# Patient Record
Sex: Female | Born: 1935 | Race: White | Hispanic: No | Marital: Single
Health system: Southern US, Community
[De-identification: ages and names within clinical notes are randomized; demographics above are authoritative.]

---

## 2013-07-12 ENCOUNTER — Inpatient Hospital Stay: Payer: Self-pay | Admitting: Specialist

## 2013-07-12 LAB — COMPREHENSIVE METABOLIC PANEL
ALK PHOS: 83 U/L
AST: 40 U/L — AB (ref 15–37)
Albumin: 3 g/dL — ABNORMAL LOW (ref 3.4–5.0)
Anion Gap: 2 — ABNORMAL LOW (ref 7–16)
BILIRUBIN TOTAL: 0.5 mg/dL (ref 0.2–1.0)
BUN: 23 mg/dL — AB (ref 7–18)
CALCIUM: 8.9 mg/dL (ref 8.5–10.1)
CREATININE: 0.98 mg/dL (ref 0.60–1.30)
Chloride: 106 mmol/L (ref 98–107)
Co2: 33 mmol/L — ABNORMAL HIGH (ref 21–32)
EGFR (Non-African Amer.): 52 — ABNORMAL LOW
Glucose: 80 mg/dL (ref 65–99)
Osmolality: 284 (ref 275–301)
Potassium: 4 mmol/L (ref 3.5–5.1)
SGPT (ALT): 26 U/L (ref 12–78)
Sodium: 141 mmol/L (ref 136–145)
Total Protein: 7 g/dL (ref 6.4–8.2)

## 2013-07-12 LAB — PROTIME-INR
INR: 1
Prothrombin Time: 13.3 secs (ref 11.5–14.7)

## 2013-07-12 LAB — VALPROIC ACID LEVEL: Valproic Acid: 52 ug/mL

## 2013-07-12 LAB — CBC
HCT: 41.5 % (ref 35.0–47.0)
HGB: 13.9 g/dL (ref 12.0–16.0)
MCH: 34.2 pg — ABNORMAL HIGH (ref 26.0–34.0)
MCHC: 33.4 g/dL (ref 32.0–36.0)
MCV: 102 fL — ABNORMAL HIGH (ref 80–100)
PLATELETS: 184 10*3/uL (ref 150–440)
RBC: 4.06 10*6/uL (ref 3.80–5.20)
RDW: 13.7 % (ref 11.5–14.5)
WBC: 9.2 10*3/uL (ref 3.6–11.0)

## 2013-07-12 LAB — TROPONIN I

## 2013-07-13 LAB — CBC WITH DIFFERENTIAL/PLATELET
BASOS ABS: 0 10*3/uL (ref 0.0–0.1)
Basophil %: 0.2 %
Eosinophil #: 0 10*3/uL (ref 0.0–0.7)
Eosinophil %: 0.1 %
HCT: 37.2 % (ref 35.0–47.0)
HGB: 12.3 g/dL (ref 12.0–16.0)
LYMPHS PCT: 3.8 %
Lymphocyte #: 0.4 10*3/uL — ABNORMAL LOW (ref 1.0–3.6)
MCH: 33.8 pg (ref 26.0–34.0)
MCHC: 33.1 g/dL (ref 32.0–36.0)
MCV: 102 fL — ABNORMAL HIGH (ref 80–100)
Monocyte #: 1.2 x10 3/mm — ABNORMAL HIGH (ref 0.2–0.9)
Monocyte %: 11.3 %
NEUTROS PCT: 84.6 %
Neutrophil #: 9.2 10*3/uL — ABNORMAL HIGH (ref 1.4–6.5)
PLATELETS: 179 10*3/uL (ref 150–440)
RBC: 3.64 10*6/uL — ABNORMAL LOW (ref 3.80–5.20)
RDW: 13.7 % (ref 11.5–14.5)
WBC: 10.9 10*3/uL (ref 3.6–11.0)

## 2013-07-13 LAB — BASIC METABOLIC PANEL
ANION GAP: 9 (ref 7–16)
BUN: 15 mg/dL (ref 7–18)
CHLORIDE: 101 mmol/L (ref 98–107)
CREATININE: 0.77 mg/dL (ref 0.60–1.30)
Calcium, Total: 8.4 mg/dL — ABNORMAL LOW (ref 8.5–10.1)
Co2: 29 mmol/L (ref 21–32)
EGFR (Non-African Amer.): >60
Glucose: 120 mg/dL — ABNORMAL HIGH (ref 65–99)
Osmolality: 280 (ref 275–301)
POTASSIUM: 4 mmol/L (ref 3.5–5.1)
SODIUM: 139 mmol/L (ref 136–145)

## 2013-07-14 LAB — CBC WITH DIFFERENTIAL/PLATELET
BASOS PCT: 0.5 %
Basophil #: 0 10*3/uL (ref 0.0–0.1)
Eosinophil #: 0.2 10*3/uL (ref 0.0–0.7)
Eosinophil %: 1.7 %
HCT: 39.3 % (ref 35.0–47.0)
HGB: 13.1 g/dL (ref 12.0–16.0)
Lymphocyte #: 1.1 10*3/uL (ref 1.0–3.6)
Lymphocyte %: 11.3 %
MCH: 34.4 pg — ABNORMAL HIGH (ref 26.0–34.0)
MCHC: 33.2 g/dL (ref 32.0–36.0)
MCV: 104 fL — ABNORMAL HIGH (ref 80–100)
Monocyte #: 1.8 x10 3/mm — ABNORMAL HIGH (ref 0.2–0.9)
Monocyte %: 18.6 %
NEUTROS ABS: 6.7 10*3/uL — AB (ref 1.4–6.5)
NEUTROS PCT: 67.9 %
PLATELETS: 165 10*3/uL (ref 150–440)
RBC: 3.8 10*6/uL (ref 3.80–5.20)
RDW: 13.9 % (ref 11.5–14.5)
WBC: 9.8 10*3/uL (ref 3.6–11.0)

## 2014-02-08 ENCOUNTER — Inpatient Hospital Stay: Payer: Self-pay | Admitting: Internal Medicine

## 2014-02-08 LAB — BASIC METABOLIC PANEL
Anion Gap: 7 (ref 7–16)
BUN: 36 mg/dL — AB (ref 7–18)
CALCIUM: 9.4 mg/dL (ref 8.5–10.1)
CHLORIDE: 106 mmol/L (ref 98–107)
Co2: 32 mmol/L (ref 21–32)
Creatinine: 1.1 mg/dL (ref 0.60–1.30)
GFR CALC NON AF AMER: 50 — AB
GLUCOSE: 139 mg/dL — AB (ref 65–99)
Osmolality: 299 (ref 275–301)
Potassium: 4.4 mmol/L (ref 3.5–5.1)
Sodium: 145 mmol/L (ref 136–145)

## 2014-02-08 LAB — CBC
HCT: 47.1 % — AB (ref 35.0–47.0)
HGB: 15.1 g/dL (ref 12.0–16.0)
MCH: 33.6 pg (ref 26.0–34.0)
MCHC: 32 g/dL (ref 32.0–36.0)
MCV: 105 fL — ABNORMAL HIGH (ref 80–100)
PLATELETS: 177 10*3/uL (ref 150–440)
RBC: 4.49 10*6/uL (ref 3.80–5.20)
RDW: 13.7 % (ref 11.5–14.5)
WBC: 10 10*3/uL (ref 3.6–11.0)

## 2014-02-08 LAB — PRO B NATRIURETIC PEPTIDE: B-Type Natriuretic Peptide: 8276 pg/mL — ABNORMAL HIGH (ref 0–450)

## 2014-02-08 LAB — TROPONIN I: Troponin-I: 0.04 ng/mL

## 2014-02-09 LAB — BASIC METABOLIC PANEL
Anion Gap: 15 (ref 7–16)
BUN: 32 mg/dL — AB (ref 7–18)
CHLORIDE: 106 mmol/L (ref 98–107)
Calcium, Total: 8.6 mg/dL (ref 8.5–10.1)
Co2: 26 mmol/L (ref 21–32)
Creatinine: 0.99 mg/dL (ref 0.60–1.30)
GFR CALC NON AF AMER: 57 — AB
GLUCOSE: 123 mg/dL — AB (ref 65–99)
Osmolality: 301 (ref 275–301)
Potassium: 3.9 mmol/L (ref 3.5–5.1)
SODIUM: 147 mmol/L — AB (ref 136–145)

## 2014-02-09 LAB — CBC WITH DIFFERENTIAL/PLATELET
BANDS NEUTROPHIL: 2 %
HCT: 44.4 % (ref 35.0–47.0)
HGB: 14.5 g/dL (ref 12.0–16.0)
Lymphocytes: 10 %
MCH: 34.2 pg — ABNORMAL HIGH (ref 26.0–34.0)
MCHC: 32.7 g/dL (ref 32.0–36.0)
MCV: 105 fL — AB (ref 80–100)
MONOS PCT: 5 %
Metamyelocyte: 1 %
NRBC/100 WBC: 7 /
PLATELETS: 150 10*3/uL (ref 150–440)
RBC: 4.25 10*6/uL (ref 3.80–5.20)
RDW: 13.8 % (ref 11.5–14.5)
SEGMENTED NEUTROPHILS: 81 %
VARIANT LYMPHOCYTE - H1-RLYMPH: 1 %
WBC: 9 10*3/uL (ref 3.6–11.0)

## 2014-02-09 LAB — TROPONIN I
Troponin-I: 0.05 ng/mL
Troponin-I: 0.05 ng/mL

## 2014-02-09 LAB — CK TOTAL AND CKMB (NOT AT ARMC)
CK, Total: 23 U/L — ABNORMAL LOW (ref 26–192)
CK, Total: 24 U/L — ABNORMAL LOW (ref 26–192)
CK-MB: 0.6 ng/mL (ref 0.5–3.6)
CK-MB: 0.7 ng/mL (ref 0.5–3.6)

## 2014-02-09 LAB — TSH: Thyroid Stimulating Horm: 1.77 u[IU]/mL

## 2014-02-09 LAB — MAGNESIUM: Magnesium: 1.9 mg/dL

## 2014-02-10 LAB — BASIC METABOLIC PANEL
ANION GAP: 8 (ref 7–16)
BUN: 24 mg/dL — AB (ref 7–18)
CO2: 34 mmol/L — AB (ref 21–32)
Calcium, Total: 8.5 mg/dL (ref 8.5–10.1)
Chloride: 100 mmol/L (ref 98–107)
Creatinine: 0.96 mg/dL (ref 0.60–1.30)
EGFR (Non-African Amer.): 59 — ABNORMAL LOW
Glucose: 94 mg/dL (ref 65–99)
OSMOLALITY: 287 (ref 275–301)
Potassium: 3.8 mmol/L (ref 3.5–5.1)
Sodium: 142 mmol/L (ref 136–145)

## 2014-02-10 LAB — URINALYSIS, COMPLETE
Bilirubin,UR: NEGATIVE
Blood: NEGATIVE
Glucose,UR: NEGATIVE mg/dL (ref 0–75)
LEUKOCYTE ESTERASE: NEGATIVE
Nitrite: NEGATIVE
Ph: 5 (ref 4.5–8.0)
Protein: NEGATIVE
RBC,UR: 1 /HPF (ref 0–5)
Specific Gravity: 1.024 (ref 1.003–1.030)
Squamous Epithelial: 4

## 2014-02-10 LAB — CBC WITH DIFFERENTIAL/PLATELET
HCT: 45.8 % (ref 35.0–47.0)
HGB: 15.2 g/dL (ref 12.0–16.0)
Lymphocytes: 15 %
MCH: 34.3 pg — ABNORMAL HIGH (ref 26.0–34.0)
MCHC: 33.2 g/dL (ref 32.0–36.0)
MCV: 103 fL — ABNORMAL HIGH (ref 80–100)
MONOS PCT: 5 %
NRBC/100 WBC: 1 /
Platelet: 156 10*3/uL (ref 150–440)
RBC: 4.44 10*6/uL (ref 3.80–5.20)
RDW: 13.4 % (ref 11.5–14.5)
SEGMENTED NEUTROPHILS: 80 %
WBC: 11.4 10*3/uL — ABNORMAL HIGH (ref 3.6–11.0)

## 2014-02-13 LAB — CULTURE, BLOOD (SINGLE)

## 2014-05-29 ENCOUNTER — Inpatient Hospital Stay
Admit: 2014-05-29 | Disposition: A | Discharge status: Skilled Nursing Facility | Payer: Self-pay | Attending: Internal Medicine | Admitting: Internal Medicine

## 2014-05-29 LAB — CBC
HCT: 44.2 % (ref 35.0–47.0)
HGB: 15.1 g/dL (ref 12.0–16.0)
MCH: 35.3 pg — ABNORMAL HIGH (ref 26.0–34.0)
MCHC: 34.2 g/dL (ref 32.0–36.0)
MCV: 103 fL — ABNORMAL HIGH (ref 80–100)
Platelet: 234 10*3/uL (ref 150–440)
RBC: 4.28 10*6/uL (ref 3.80–5.20)
RDW: 15 % — ABNORMAL HIGH (ref 11.5–14.5)
WBC: 9.6 10*3/uL (ref 3.6–11.0)

## 2014-05-29 LAB — URINALYSIS, COMPLETE
Bacteria: NONE SEEN
Bilirubin,UR: NEGATIVE
Blood: NEGATIVE
GLUCOSE, UR: NEGATIVE mg/dL (ref 0–75)
LEUKOCYTE ESTERASE: NEGATIVE
Nitrite: NEGATIVE
PH: 7 (ref 4.5–8.0)
Protein: NEGATIVE
Specific Gravity: 1.018 (ref 1.003–1.030)
WBC UR: NONE SEEN /HPF (ref 0–5)

## 2014-05-29 LAB — COMPREHENSIVE METABOLIC PANEL
ALK PHOS: 110 U/L
Albumin: 3 g/dL — ABNORMAL LOW
Anion Gap: 9 (ref 7–16)
BUN: 13 mg/dL
Bilirubin,Total: 1.1 mg/dL
CALCIUM: 8.6 mg/dL — AB
CHLORIDE: 99 mmol/L — AB
CO2: 30 mmol/L
Creatinine: 0.8 mg/dL
EGFR (African American): >60
Glucose: 99 mg/dL
POTASSIUM: 3.6 mmol/L
SGOT(AST): 29 U/L
SGPT (ALT): 18 U/L
SODIUM: 138 mmol/L
Total Protein: 6.9 g/dL

## 2014-05-29 LAB — TROPONIN I
Troponin-I: 0.05 ng/mL — ABNORMAL HIGH
Troponin-I: 0.04 ng/mL — ABNORMAL HIGH
Troponin-I: 0.04 ng/mL — ABNORMAL HIGH

## 2014-05-29 LAB — VALPROIC ACID LEVEL: Valproic Acid: <10 ug/mL — ABNORMAL LOW (ref 50–100)

## 2014-05-29 LAB — DIGOXIN LEVEL: Digoxin: 0.5 ng/mL

## 2014-05-29 LAB — LIPASE, BLOOD: LIPASE: 31 U/L

## 2014-05-30 LAB — CBC WITH DIFFERENTIAL/PLATELET
Basophil #: 0.1 10*3/uL (ref 0.0–0.1)
Basophil %: 0.8 %
EOS PCT: 0.7 %
Eosinophil #: 0.1 10*3/uL (ref 0.0–0.7)
HCT: 42.3 % (ref 35.0–47.0)
HGB: 14 g/dL (ref 12.0–16.0)
Lymphocyte #: 1.1 10*3/uL (ref 1.0–3.6)
Lymphocyte %: 13 %
MCH: 34.3 pg — AB (ref 26.0–34.0)
MCHC: 33.1 g/dL (ref 32.0–36.0)
MCV: 104 fL — ABNORMAL HIGH (ref 80–100)
MONOS PCT: 10.8 %
Monocyte #: 0.9 x10 3/mm (ref 0.2–0.9)
NEUTROS PCT: 74.7 %
Neutrophil #: 6.1 10*3/uL (ref 1.4–6.5)
PLATELETS: 238 10*3/uL (ref 150–440)
RBC: 4.08 10*6/uL (ref 3.80–5.20)
RDW: 15.1 % — AB (ref 11.5–14.5)
WBC: 8.1 10*3/uL (ref 3.6–11.0)

## 2014-05-30 LAB — BASIC METABOLIC PANEL
Anion Gap: 9 (ref 7–16)
BUN: 11 mg/dL
CHLORIDE: 98 mmol/L — AB
CO2: 31 mmol/L
CREATININE: 0.65 mg/dL
Calcium, Total: 8.2 mg/dL — ABNORMAL LOW
EGFR (Non-African Amer.): >60
Glucose: 94 mg/dL
Potassium: 3.3 mmol/L — ABNORMAL LOW
Sodium: 138 mmol/L

## 2014-05-30 LAB — LIPID PANEL
CHOLESTEROL: 188 mg/dL
HDL: 39 mg/dL — AB
Ldl Cholesterol, Calc: 119 mg/dL — ABNORMAL HIGH
Triglycerides: 152 mg/dL — ABNORMAL HIGH
VLDL CHOLESTEROL, CALC: 30 mg/dL

## 2014-06-05 NOTE — Consult Note (Signed)
PATIENT NAME:  Beverly Warren, BOATMAN MR#:  [id number] DATE OF BIRTH:  [birth year]  DATE OF CONSULTATION:  07/12/2013  REFERRING PHYSICIAN:  Valinda Hoar, MD CONSULTING PHYSICIAN:  Duane Lope. Judithann Sheen, MD  FAMILY PHYSICIAN:  [employer].   REASON FOR CONSULTATION:  Preop medical clearance prior to ankle surgery.   HISTORY OF PRESENT ILLNESS: The patient is an 81 year old female with a long-standing history of Alzheimer's dementia who resides at the memory care unit of [facility]. She has a history of aortic valve replacement complicated by transient atrial fibrillation. She has a pacemaker in place which is followed yearly at [facility]. Also has a history of stroke. Presents after on an unwitnessed fall to the Emergency Room with ankle pain. Was found to have an ankle fracture. She does not remember falling. Denies chest pain or shortness of breath. Denies dizziness or palpitations. She denied syncope. She has been admitted by orthopedics for ankle surgery and consultation was subsequently requested.   PAST MEDICAL HISTORY: 1.  Previous stroke in 2012.  2.  Alzheimer's dementia.  3.  Hyperlipidemia.  4.  Benign hypertension.  5.  Status post aortic valve replacement.  6.  Status post pacemaker implant.  7.  History of transient atrial fibrillation, postoperative valve repair which has been stable for over a year now.   MEDICATIONS ON ADMISSION:  1.  Zoloft 25 mg p.o. at bedtime.  2.  Seroquel 12.5 mg p.o. daily as needed for agitation.  3.  Pravastatin 10 mg p.o. daily.  4.  Namenda 10 mg p.o. daily.  5.  Claritin 10 mg p.o. daily.  6.  Flonase 2 puffs in each nostril daily.  7.  Aricept 10 mg p.o. at bedtime.  8.  Depakote 250 mg p.o. q.a.m. and 500 mg p.o. q.p.m.  9.  Aspirin 81 mg p.o. daily.  10.  Fosamax 70 mg p.o. q. week.   ALLERGIES: PENICILLIN, SULFA, CODEINE, ACE INHIBITORS, DILTIAZEM, MORPHINE AND TESSALON.   SOCIAL HISTORY: Negative for alcohol or tobacco abuse.    FAMILY HISTORY: Unable to obtain from the patient due to her dementia.   REVIEW OF SYSTEMS: Unable to obtain because of her confusion.   PHYSICAL EXAMINATION: GENERAL: The patient is in no acute distress.  VITAL SIGNS: Currently remarkable for a blood pressure of 190/77, heart rate 69, respiratory rate of 18, temperature of 97.5.  HEENT: Normocephalic, atraumatic. Pupils equal, round, reactive to light and accommodation. Extraocular movements are intact. Sclerae are anicteric. Conjunctivae are clear. Oropharynx is clear.  NECK: Supple without JVD. No adenopathy or thyromegaly is noted.  LUNGS: Clear to auscultation and percussion without wheezes, rales or rhonchi. No dullness. Respiratory effort is normal.  CARDIAC: Regular rate and rhythm with a normal S1, S2. There is a 2/6 systolic murmur noted. No rubs or gallops are present. PMI is nondisplaced. Chest wall is nontender.  ABDOMEN: Soft, nontender, with normoactive bowel sounds. No organomegaly or masses were appreciated. No hernias or bruits were noted.  EXTREMITIES: Revealed trace edema. Pulses were 2+ bilaterally.  SKIN: Warm and dry without rash or lesions.  NEUROLOGIC: Cranial nerves II through XII grossly intact. Deep tendon reflexes were symmetric. Motor and sensory exam is nonfocal.  PSYCHIATRIC: Revealed a patient who was alert and oriented to person only. She was pleasant.   LABORATORY, DIAGNOSTIC AND RADIOLOGICAL DATA:  EKG reveals sinus rhythm with a left bundle branch block versus paced rhythm at 65 beats per minute. Chest x-ray was unremarkable. Troponin was  less than 0.02. Glucose 80 with a BUN of 23, creatinine 0.98 and a GFR of greater than 60. White count 9.2 with a hemoglobin of 13.9.   ASSESSMENT: 1.  Right ankle fracture.  2.  History of transient atrial fibrillation, now resolved.  3.  Pacemaker status.  4.  Status post aortic valve replacement.  5.  Benign hypertension.  6.  Hyperlipidemia.  7.  Alzheimer's  dementia.   PLAN: The patient appears medically stable and is cleared for orthopedic surgery. We will continue her outpatient regimen for now. Follow up routine labs in the morning.   Thank you for the consultation. We will continue to follow this patient with you while in the hospital. Please call if questions arise.   ____________________________ Duane LopeJeffrey D. Judithann SheenSparks, MD jds:cs D: 07/12/2013 13:07:44 ET T: 07/12/2013 14:56:23 ET JOB#: [id number]  cc: Duane LopeJeffrey D. Judithann SheenSparks, MD, <Dictator> Elvera Almario Rodena Medin Avril Busser MD ELECTRONICALLY SIGNED 07/12/2013 22:38

## 2014-06-05 NOTE — Discharge Summary (Signed)
PATIENT NAME:  Beverly Warren, Beverly MR#:  [id number] DATE OF BIRTH:  [birth year]  DATE OF ADMISSION:  07/12/2013 DATE OF DISCHARGE:  07/15/2013  FINAL DIAGNOSES:  1.  Displaced bimalleolar fracture-dislocation, right ankle.  2.  Advanced dementia.  3.  History of atrial fibrillation with pacemaker. 4.  Prior stroke in 2012. 5.  Alzheimer's dementia. 6.  Hyperlipidemia. 7.  Benign hypertension, status post aortic valve replacement and status post pacemaker implant.   OPERATION: On 07/12/2013, open reduction internal fixation of right ankle fracture.   COMPLICATIONS: None.   CONSULTATIONS: Dr. Judithann SheenSparks with medical service.   DISCHARGE MEDICATIONS:  1.  Enteric-coated aspirin 1 p.o. b.i.d. for 6 weeks.  2.  Norco 5/325 p.r.n. pain.   HOME MEDICATIONS: As on admission.   HISTORY OF PRESENT ILLNESS: The patient is an 81 year old female, [facility], who fell on the date of admission injuring her right ankle. She was brought to the Emergency Room. Her exam and x-rays revealed a bimalleolar fracture-dislocation of the ankle. She had significant osteopenia. Discussed treatment options with the patient's son including cast treatment and surgery. Risks and benefits of each were discussed. He requested we proceed with surgical stabilization of the ankle.   PAST MEDICAL HISTORY: As above.   HOME MEDICATIONS: Zoloft 25 mg at bedtime; Seroquel 12.5 mg daily as needed; Pravastatin 10 mg daily; Namenda 10 mg daily; Claritin 10 mg daily; Flonase 2 puffs daily; Aricept 10 mg at bedtime; Depakote 250 mg in the a.m. and 500 mg in the p.m.; aspirin 81 mg daily and Fosamax 75 mg weekly.   ALLERGIES: PENICILLIN, SULFA, CODEINE ACE INHIBITORS, DILTIAZEM, MORPHINE AND TESSALON.   REVIEW OF SYSTEMS: Otherwise unremarkable.   SOCIAL HISTORY: The patient does not smoke or drink. She stays at assisted living.   FAMILY HISTORY: Remarkable.   PHYSICAL EXAMINATION: The patient was  awake and alert, but confused. The right ankle showed tenderness and ecchymosis medially and laterally. The skin was intact. The ankle was unstable. Neurovascular status was good distally. No other injuries were identified.   LABORATORY DATA: On admission was satisfactory.   HOSPITAL COURSE: After consultation with the medical service, the patient was taken to surgery where she underwent open reduction and internal fixation of the right ankle. Postoperatively, she was mobilized in a posterior splint and a well-padded dressing. She did well with no significant medical problems. The dressings were changed with the wounds appearing to be very benign on the third postoperative day. Short leg cast was applied. She is stable and ready for skilled nursing transfer. She is to remain nonweightbearing on the right leg. We will see her back in my office in 2 weeks for exam and x-ray of the ankle. ____________________________ Valinda HoarHoward E. Tityana Pagan, MD hem:aw D: 07/15/2013 11:17:38 ET T: 07/15/2013 11:34:34 ET JOB#: [id number]  cc: Valinda HoarHoward E. Sharaine Delange, MD, <Dictator> Valinda HoarHOWARD E Breelynn Bankert MD ELECTRONICALLY SIGNED 07/16/2013 10:09

## 2014-06-05 NOTE — H&P (Signed)
PATIENT NAME:  Beverly Warren, Beverly Warren MR#:  [id number] DATE OF BIRTH:  June 06, 1925  DATE OF ADMISSION:  02/08/2014  PRIMARY CARE PHYSICIAN: [facility]   EMERGENCY ROOM PHYSICIAN: Lurena Joiner L. Lord, MD   CHIEF COMPLAINT: An 81 year old female brought in because of atrial fibrillation with rapid ventricular response and CHF.   HISTORY OF PRESENT ILLNESS: This is an 81 year old female patient comes from [facility] because of tachycardia and CHF. The patient has been having cough for a long time, like wet cough a long time with leg swelling. The patient was seen by [facility] making [facility] today and she was found to have a heart rate of 164 and she also had a chest x-ray showing CHF, so she was sent in here for further evaluation. The patient has dementia, unable to give history, but history obtained from the charts and also the  patient's son at the bedside. The patient received 1 dose of 2.5 of metoprolol in the Emergency Room and the patient's heart rate has been still 120s to 130s with blood pressure 110/70, so I called Dr. Juliann Pares. He suggested to give amiodarone bolus and drip and also digoxin for rate control and he will see the patient tomorrow.   PAST MEDICAL HISTORY: Significant for history of recent admission in June of this year. She has a history of severe dementia with behavioral disturbances, hypertension, COPD, hyperlipidemia.   SOCIAL HISTORY: No smoking, no drinking, no drugs. Lives at [facility].   PAST SURGICAL HISTORY: Significant for ankle surgery, hip surgery and history of heart valve at [facility]. The patient has a pig valve placement.   ALLERGIES: SHE IS ALLERGIC TO ACE INHIBITORS, CODEINE, CARDIZEM, MORPHINE, PENICILLIN, SULFA AND TESSALON PERLES.  MEDICATIONS: She is on: 1.  Aspirin 81 mg daily. 2.  Depakote 250 mg in the morning and 500 mg at bedtime. 3.  Colace 240 mg daily. 4.  Aricept 10 mg p.o. daily.  5.  Levaquin 500 mg  every 24 hours, this is started yesterday.  6.  Loratadine 10 mg p.o. daily. 7.  Melatonin 1 mg p.o. at bedtime. 8.  9.  Albuterol 90 mcg inhalation 2 puffs every 6 hours.  10.  Neurontin 400 mg p.o. b.i.d.  11.  Namenda 10 mg p.o. daily.  12.  Pravastatin 10 mg p.o. daily. 13.  Sertraline 25 mg p.o. daily. 14.  Tylenol as needed 2 tablets every 6 hours for the pain.  REVIEW OF SYSTEMS: Not obtainable because of severe dementia.   FAMILY HISTORY: The patient has no family history of hypertension or diabetes.   ACTIVITY: The patient used to walk with a walker until recently and stopped walking for the last 3-4 days.  CODE STATUS: Full code. Discussed with the family, especially the son who is the healthcare power of attorney.  PHYSICAL EXAMINATION:  VITAL SIGNS: Temperature is 96.1, heart rate around 130s to 140s in atrial fibrillation, blood pressure 124/80 during my visit and the patient saturations 99% on room air.  GENERAL: She is a well-developed, well-nourished female, not in distress. The patient is not oriented to time, place, person.  HEAD: Normocephalic, atraumatic.  EARS: No drainage. No external lesions. MOUTH: No lesions. No exudates.  NECK: Supple. No JVD. No carotid bruit.  RESPIRATORY: Good respiratory effort. Clear to auscultation.  CARDIOVASCULAR: S1, S2 irregularly irregular. The patient does have 2+ pedal edema.  ABDOMEN: Soft, nontender. No organomegaly. No hernias.  MUSCULOSKELETAL: The patient's gait  is not tested. The patient has no cyanosis or clubbing.  SKIN: Inspection is normal.  NEUROLOGIC: Does not appear to have any neurological deficits. No facial droop, no gross neurological deficit is observed, but she cannot participate in neurological examination secondary to severe dementia.  PSYCHIATRIC: Has dementia with behavioral disturbances.   LABORATORY DATA: Electrolytes: Sodium 145, potassium 4.4, chloride 106, bicarbonate 32, BUN 36, creatinine 1, glucose  139. BNP 8276. WBC 10, hemoglobin 15.1, hematocrit 47.1, platelets 177,000.   Chest x-ray shows COPD, cardiomegaly, left basilar scarring.   Troponin 0.04.   EKG: Atrial fibrillation with RVR, 166 beats per minutes.   ASSESSMENT AND PLAN:  1.  The patient is an 81 year old female with severe dementia, has atrial fibrillation with rapid ventricular response and congestive heart failure. The patient is admitted to ICU. Start her on amiodarone drip. SHE IS ALLERGIC TO CARDIZEM; so we are starting on amiodarone drip and also will give digoxin 0.5 mg 1 dose and 0.25 every 4 hours, up to 1 gram dose over 24 hours, and the patient will be on amiodarone drip. Dr. Juliann Paresallwood will see her tomorrow. Obtain echocardiogram. Cycle the troponins.  2.  Severe dementia with behavioral disturbances. Restart her home medications. with depakote.  3.  Recently diagnosed bronchitis/pneumonia. She is on Levaquin. The patient can get Levaquin, but we will have to adjust the amiodarone drip due to interaction.   CODE STATUS: Full code. Discussed this plan with patient's son. I will involve the palliative care.   TIME SPENT: Fifty-five minutes.    ____________________________ Katha HammingSnehalatha Jasai Sorg, MD sk:TT D: 02/08/2014 20:58:00 ET T: 02/08/2014 21:20:19 ET JOB#: [id number]  cc: Katha HammingSnehalatha Chanler Mendonca, MD, <Dictator> Katha HammingSNEHALATHA Tailynn Armetta MD ELECTRONICALLY SIGNED 02/08/2014 22:34

## 2014-06-05 NOTE — H&P (Signed)
Subjective/Chief Complaint Right ankle pain   History of Present Illness 81 year old female with dementia feol at [facility] this morning after breakfast injuring the right ankle.  Brought to Emergency Room where X-rays show a bimalleolar fracture dislocation of the right ankle.  Discussed treatment with her son who feels that she does walk a good bit and he would prefer surgical fixation if possible. Risks and benefits of surgery were discussed at length including but not limited to infection, non union, nerve or blood vessed damage, non union, need for repeat surgery, blood clots and lung emboli, and death. Will proceed with surgery later today if cleared by medica service.   Past Med/Surgical Hx:  right ankle fracture:   CVA 2012:   HLP:   HTN:   atrial fibrilation:   Dementia:   Aortic Valve Replacement:   Pacemaker:   ALLERGIES:  Penicillin: Unknown  Sulfa drugs: Unknown  Codeine: Unknown  Ace Inhibitors: Unknown  Diltiazem: Unknown  Tessalon perles: Unknown  Morphine: Unknown  HOME MEDICATIONS: Medication Instructions Status  divalproex sodium extended release 250 mg oral tablet, extended release 1 tab(s) orally once a day (in the morning) Active  Namenda 10 mg oral tablet 1 tab(s) orally once a day Active  aspirin 81 mg oral tablet, chewable 1 tab(s) orally once a day Active  Therems Therapeutic Multiple Vitamins oral tablet 1 tab(s) orally once a day Active  loratadine 10 mg oral tablet 1 tab(s) orally once a day Active  fluticasone nasal 50 mcg/inh nasal spray 2 spray(s) in each nostril once a day Active  alendronate 70 mg oral tablet 1 tab(s) orally early morning once a week on Friday 30 min. before food/meds with water. Don't lie down for 30 minutes.  Active  Calcium 600+D 1 tab(s) orally 2 times a day Active  Tylenol 500 mg oral tablet 2 tabs (1022m) orally 2 times a day Active  donepezil 10 mg oral tablet 1 tab(s) orally once a day (at bedtime) Active  sertraline  25 mg oral tablet 1 tab(s) orally once a day (at bedtime) Active  pravastatin 10 mg oral tablet 1 tab(s) orally once a day Active  divalproex sodium extended release 500 mg oral tablet, extended release 1 tab(s) orally once a day (at bedtime) Active  docusate sodium sodium 100 mg oral capsule 1 cap(s) orally once a day as needed for constipation Active  QUEtiapine 25 mg oral tablet 0.5 tabs (12.550m orally once a day as needed for agitation Active   Family and Social History:  Family History Non-Contributory   Social History negative tobacco, negative ETOH   Place of Living Nursing Home  Memory care unit   Review of Systems:  Fever/Chills No   Cough No   Sputum No   Abdominal Pain No   Diarrhea No   Constipation No   Physical Exam:  GEN well developed, well nourished   HEENT pink conjunctivae   NECK supple   RESP normal resp effort   CARD regular rate   ABD denies tenderness   LYMPH negative neck   EXTR negative edema, Right ankle unstable to exam.  circulation/sensation/motor function good and skin intact.  Minimal swelling as yet.  No other injuries noted.   SKIN normal to palpation   NEURO motor/sensory function intact   PSYCH alert, poor insight   Lab Results: Hepatic:  31-May-15 11:35   Bilirubin, Total 0.5  Alkaline Phosphatase 83 (45-117 NOTE: New Reference Range 01/02/13)  SGPT (ALT) 26  SGOT (AST)  40  Total Protein, Serum 7.0  Albumin, Serum  3.0  TDMs:  31-May-15 11:35   Valproic Acid, Serum 52 (50-100 POTENTIALLY TOXIC:  > 200 mcg/mL)  Routine Chem:  31-May-15 11:35   Glucose, Serum 80  BUN  23  Creatinine (comp) 0.98  Sodium, Serum 141  Potassium, Serum 4.0  Chloride, Serum 106  CO2, Serum  33  Calcium (Total), Serum 8.9  Osmolality (calc) 284  eGFR (African American) >60  eGFR (Non-African American)  52 (eGFR values <60mL/min/1.73 m2 may be an indication of chronic kidney disease (CKD). Calculated eGFR is useful in  patients with stable renal function. The eGFR calculation will not be reliable in acutely ill patients when serum creatinine is changing rapidly. It is not useful in  patients on dialysis. The eGFR calculation may not be applicable to patients at the low and high extremes of body sizes, pregnant women, and vegetarians.)  Anion Gap  2  Cardiac:  31-May-15 11:35   Troponin I < 0.02 (0.00-0.05 0.05 ng/mL or less: NEGATIVE  Repeat testing in 3-6 hrs  if clinically indicated. >0.05 ng/mL: POTENTIAL  MYOCARDIAL INJURY. Repeat  testing in 3-6 hrs if  clinically indicated. NOTE: An increase or decrease  of 30% or more on serial  testing suggests a  clinically important change)  Routine Coag:  31-May-15 11:35   Prothrombin 13.3  INR 1.0 (INR reference interval applies to patients on anticoagulant therapy. A single INR therapeutic range for coumarins is not optimal for all indications; however, the suggested range for most indications is 2.0 - 3.0. Exceptions to the INR Reference Range may include: Prosthetic heart valves, acute myocardial infarction, prevention of myocardial infarction, and combinations of aspirin and anticoagulant. The need for a higher or lower target INR must be assessed individually. Reference: The Pharmacology and Management of the Vitamin K  antagonists: the seventh ACCP Conference on Antithrombotic and Thrombolytic Therapy. Chest.2004 Sept:126 (3suppl): 2045-2335. A HCT value >55% may artifactually increase the PT.  In one study,  the increase was an average of 25%. Reference:  "Effect on Routine and Special Coagulation Testing Values of Citrate Anticoagulant Adjustment in Patients with High HCT Values." American Journal of Clinical Pathology 2006;126:400-405.)  Routine Hem:  31-May-15 11:35   WBC (CBC) 9.2  RBC (CBC) 4.06  Hemoglobin (CBC) 13.9  Hematocrit (CBC) 41.5  Platelet Count (CBC) 184 (Result(s) reported on 12 Jul 2013 at 11:50AM.)  MCV  102   MCH  34.2  MCHC 33.4  RDW 13.7   Radiology Results: XRay:    31-May-15 10:14, Ankle Right Complete  Ankle Right Complete  REASON FOR EXAM:    unwitnessed fall, right ankle pain  COMMENTS:   May transport without cardiac monitor    PROCEDURE: DXR - DXR ANKLE RIGHT COMPLETE  - Jul 12 2013 10:14AM     CLINICAL DATA:  Unwitnessed fall    EXAM:  RIGHT ANKLE - COMPLETE 3+ VIEW    COMPARISON:  None.    FINDINGS:  There is a displaced oblique fracture of the distal fibular  diametaphyseal is. There is a displaced mildly comminuted fracture  of the medial malleolus. There is widening of the distal  tibiofibular joint. There is no dislocation.    There osteoarthritic changes of the ankle. There is a plantar  calcaneal spur.    There is soft tissue swelling around the right ankle. There is  peripheral vascular atherosclerotic disease.     IMPRESSION:  1. Displaced oblique   fracture of the distal fibular diametaphyseal  is.  2. Displaced mildly comminuted fracture of the medial malleolus.  3. Widening of the distal tibiofibular joint.  Electronically Signed    By: Hetal  Patel    On: 07/12/2013 10:30         Verified By:HETAL P. PATEL, M.D., MD  LabUnknown:  PACS Image    Assessment/Admission Diagnosis Unstable bimalleolar right ankle fracture   Plan open reduction and internal fixation right ankle when cleared medically   Electronic Signatures: Miller, Howard E (MD)  (Signed 31-May-15 14:34)  Authored: CHIEF COMPLAINT and HISTORY, PAST MEDICAL/SURGIAL HISTORY, ALLERGIES, HOME MEDICATIONS, FAMILY AND SOCIAL HISTORY, REVIEW OF SYSTEMS, PHYSICAL EXAM, LABS, Radiology, ASSESSMENT AND PLAN   Last Updated: 31-May-15 14:34 by Miller, Howard E (MD) 

## 2014-06-05 NOTE — Consult Note (Signed)
Chief Complaint:  Subjective/Chief Complaint Pt with hx of AVR and transient a-fib. Pacer. Currently stable from cardiac standpoint. Denies CP or SOB.   Brief Assessment:  GEN no acute distress   Cardiac Regular   Respiratory clear BS   Gastrointestinal details normal Soft  Nontender  Bowel sounds normal   EXTR positive edema   Lab Results: Routine Chem:  31-May-15 11:35   Glucose, Serum 80  BUN  23  Creatinine (comp) 0.98  Sodium, Serum 141  Potassium, Serum 4.0  Chloride, Serum 106  CO2, Serum  33  eGFR (Non-African American)  52 (eGFR values <17m/min/1.73 m2 may be an indication of chronic kidney disease (CKD). Calculated eGFR is useful in patients with stable renal function. The eGFR calculation will not be reliable in acutely ill patients when serum creatinine is changing rapidly. It is not useful in  patients on dialysis. The eGFR calculation may not be applicable to patients at the low and high extremes of body sizes, pregnant women, and vegetarians.)  Cardiac:  31-May-15 11:35   Troponin I < 0.02 (0.00-0.05 0.05 ng/mL or less: NEGATIVE  Repeat testing in 3-6 hrs  if clinically indicated. >0.05 ng/mL: POTENTIAL  MYOCARDIAL INJURY. Repeat  testing in 3-6 hrs if  clinically indicated. NOTE: An increase or decrease  of 30% or more on serial  testing suggests a  clinically important change)  Routine Hem:  31-May-15 11:35   WBC (CBC) 9.2  Hemoglobin (CBC) 13.9  Platelet Count (CBC) 184 (Result(s) reported on 12 Jul 2013 at 11:50AM.)   Radiology Results: XRay:    31-May-15 11:32, Chest 1 View AP or PA  Chest 1 View AP or PA   REASON FOR EXAM:    preop  COMMENTS:       PROCEDURE: DXR - DXR CHEST 1 VIEWAP OR PA  - Jul 12 2013 11:32AM     CLINICAL DATA:  Recent ankle fracture    EXAM:  CHEST - 1 VIEW    COMPARISON:  None.    FINDINGS:  The heart size and mediastinal contoursare within normal limits.  Both lungs are clear. The visualized skeletal  structures are  unremarkable. Postsurgical changes are seen. A pacing device is  noted.     IMPRESSION:  No acute abnormality noted.      Electronically Signed    By: MInez CatalinaM.D.    On: 07/12/2013 11:59         Verified By: MEverlene Farrier M.D.,   Assessment/Plan:  Assessment/Plan:  Plan Pt medically stable and cleared for ankle surgery. Will follow with you.   Electronic Signatures: SIdelle Crouch(MD)  (Signed [phone]) [id number])  Authored: Chief Complaint, Brief Assessment, Lab Results, Radiology Results, Assessment/Plan   Last Updated: 31-May-15 13:02 by SIdelle Crouch(MD)

## 2014-06-05 NOTE — Op Note (Signed)
PATIENT NAME:  Beverly AgeeMCDOW, [name] MR#:  [id number] DATE OF BIRTH:  1925/08/12  DATE OF PROCEDURE:  07/12/2013  PREOPERATIVE DIAGNOSIS: Displaced unstable bimalleolar fracture-dislocation, right ankle.   POSTOPERATIVE DIAGNOSIS:  Displaced unstable bimalleolar fracture-dislocation, right ankle.   PROCEDURE PERFORMED: Open reduction and internal fixation right ankle medially and laterally (Synthes 7-hole, 3.5 mm locking plate laterally, two 4.0 mm cannulated screws medially).    SURGEON: Valinda HoarHoward E. Anarely Nicholls, M.D.   ANESTHESIA: Spinal.   COMPLICATIONS: None.   DRAINS: None.   ESTIMATED BLOOD LOSS: Minimal.   REPLACEMENTS: None.   DESCRIPTION OF PROCEDURE: The patient was brought to the Operating Room where she underwent satisfactory general LMA anesthesia in the supine position. The right leg was prepped and draped in sterile fashion. Esmarch was applied and the tourniquet inflated to 350 mmHg. Tourniquet time was 51 minutes. A long lateral incision was made and dissection carried out bluntly with periosteal elevator down to bone. The fracture was irrigated and cleared of blood and clot. There was significant comminution posteriorly in the distal fragment, precluding an anterior-to-posterior lag screw. The fracture was reduced anatomically and a 7-hole, 3.5 mm locking plate bent to fit the contour of the fibula. It was then affixed to the fibula with 4 cortical screws proximally and 3 locking screws distally. Fluoroscopy showed the fracture to be reduced nicely and the hardware to be in good position. The ankle mortise looked excellent. This wound was irrigated and closed with 3-0 Vicryl and staples.   A second incision was then made medially over the malleolus and dissection carried out bluntly through subcutaneous tissue. The fracture was irrigated again and reduced and held with a clamp. Two guidewires were inserted and then a 45 and 50 mm long-thread 4.0 cannulated screw were inserted over the guide  pins, securing the medial malleolus. Pins were removed. Fluoroscopy showed excellent position of the hardware in the joint. Medial wound was irrigated and closed with 3-0 Vicryl and staples.   Marcaine 0.5% was placed in the joint. Dry sterile dressing and posterior splint were applied. The tourniquet was deflated with good return of blood flow to the foot. The patient was transferred to her hospital bed and taken to recovery in good condition.    ____________________________ Valinda HoarHoward E. Anaaya Fuster, MD hem:cs D: 07/12/2013 17:18:58 ET T: 07/12/2013 18:32:18 ET JOB#: [id number]  cc: Valinda HoarHoward E. Jylian Pappalardo, MD, <Dictator> Valinda HoarHOWARD E Zuly Belkin MD ELECTRONICALLY SIGNED 07/12/2013 21:25

## 2014-06-07 LAB — SURGICAL PATHOLOGY

## 2014-06-09 NOTE — Consult Note (Signed)
PATIENT NAME:  Beverly Warren, Beverly Warren MR#:  [id number] DATE OF BIRTH:  Apr 08, 1925  DATE OF CONSULTATION:  02/09/2014  REFERRING PHYSICIAN:  Katha Hamming, MD  CONSULTING PHYSICIAN:  Niklas Chretien D. Shailee Foots, MD  PRIMARY PHYSICIAN: [facility].  INDICATION: Atrial fibrillation.   HISTORY OF PRESENT ILLNESS: The patient is an 81 year old female recently moved from [place]. She is a resident at [facility] presenting with tachycardia, congestive heart failure.  The patient has had some cough, congestion for the last few weeks with leg swelling. Primary physician came and checked on and found her heart rate was fast at 160. Chest x-ray was done, which showed congestive heart failure. The patient denied any chest pain and anginal symptoms but has dementia, so history is difficult, so she was brought to the Emergency Room. She received metoprolol, but her blood pressure is running low so she got amiodarone and digoxin and then was admitted for further evaluation and care. Denies recent tachycardia but has had atrial fibrillation in the past after heart surgery of her valve in [place] 3 years ago. She is not on anticoagulation.   PAST MEDICAL HISTORY: Dementia; hypertension; COPD; hyperlipidemia; history of atrial fibrillation, paroxysmal; valve disease.   PAST SURGICAL HISTORY: Valve replacement and coronary artery bypass surgery.   SOCIAL HISTORY: Retired, dementia, lives [place]. No smoking or alcohol consumption.   PAST SURGICAL HISTORY: Ankle surgery, hip surgery, heart valve surgery.   ALLERGIES: ACE INHIBITORS, CODEINE, CARDIZEM, MORPHINE, PENICILLIN, SULFA, TESSALON PERLES.   MEDICATIONS: Aspirin 81 mg a day, Depakote 250 in the morning 500 at bedtime, Colace 240 a day, Aricept 10 mg a day, Levaquin 500 once a day, loratadine 10 mg a day, melatonin 1 mg daily at bedtime, albuterol nebulizers every 6 hours as needed, Dilantin 400 mg twice a day, Namenda 10 mg  a day, pravastatin 10 mg a day, sertraline 25 a day, Tylenol as needed.   REVIEW OF SYSTEMS: Unobtainable because of dementia.   FAMILY HISTORY: Unremarkable.   PHYSICAL EXAMINATION:  VITAL SIGNS: Blood pressure 130/80, pulse of 100 and irregular, respiratory rate 16, afebrile.  HEENT: Normocephalic, atraumatic. Pupils equal and reactive to light.  NECK: Supple. No significant JVD, bruits, or adenopathy.  LUNGS: Bilateral rhonchi. No significant rales. Adequate air movement. HEART: Irregularly irregular. Systolic ejection murmur at the apex. PMI displaced laterally.  ABDOMEN: Benign.  EXTREMITIES: Within normal limits.  NEUROLOGIC: Intact.  SKIN: Within normal limits. PSYCHIATRIC: The patient is demented, confused.   LABORATORY DATA: Sodium 145, potassium 4.4, chloride 106, bicarbonate of 32, BUN 36, creatinine of 1, glucose of 139. BNP 8276. White count of 10, hemoglobin of 15, hematocrit 47, platelet count of 177,000. Chest x-ray: COPD, cardiomegaly, mild bibasilar scarring. Troponin 0.04.   EKG: Atrial fibrillation, rapid ventricular response, initially at 160, now down to about 100.   ASSESSMENT: Rapid atrial fibrillation, relative hypotension, dementia, valvular disease, murmur, heart failure, degenerative joint disease, hypertension, hyperlipidemia.   PLAN:  1.  I agree with admission. Rule out for myocardial infarction. Continue current therapy for heart failure and atrial fibrillation.  2.  Continue rate control with amiodarone as well as digoxin. Low-dose metoprolol may be helpful, but blood pressure is running low. The patient is allergic to Cardizem so our options are relatively limited.  3.  Do not recommend anticoagulation because of her comorbid state.  4.  Continue hypertension control as necessary.  5.  Continue Namenda and Aricept for dementia.  6.  Continue Pravachol for  hyperlipidemia.  7.  Follow up troponins and EKGs for evidence of cardiac event, but in any case  we will treat the patient medically because of her comorbid state and see how the patient responds. Would recommend long-term amiodarone therapy as well as digoxin as long as the blood pressure remained stable, low dose beta blockers as well. [facility] up as an [facility] as needed.   ____________________________ Bobbie Stackwayne D. Juliann Paresallwood, MD ddc:bm D: 02/10/2014 00:06:48 ET T: 02/10/2014 02:19:42 ET JOB#: [id number]  cc: Abygale Karpf D. Juliann Paresallwood, MD, <Dictator> Alwyn PeaWAYNE D Lucky Trotta MD ELECTRONICALLY SIGNED 03/03/2014 14:04

## 2014-06-09 NOTE — Discharge Summary (Signed)
PATIENT NAME:  Beverly Warren, Beverly Warren MR#:  [id number] DATE OF BIRTH:  12/01/1925  DATE OF ADMISSION:  02/08/2014 DATE OF DISCHARGE:    ADMITTING PHYSICIAN: Dr. Luberta MutterKonidena.    DISCHARGING PHYSICIAN: Dr. Enid Baasadhika Richetta Cubillos.   PRIMARY CARE PHYSICIAN: [facility].    CONSULTATIONS IN THE HOSPITAL: Cardiology consultation by Dr. Juliann Paresallwood.   DISCHARGE DIAGNOSES:  1.  Atrial fibrillation with rapid ventricular response, sick sinus syndrome status post pacemaker placement.  2.  Dementia with behavioral disturbances.  3.  Congestive heart failure with systolic dysfunction, ejection fraction of 25%-30%.  4.  Severe aortic stenosis, bileaflet aortic valve.  5.  Chronic obstructive pulmonary disease.  6.  Pneumonia.  7.  History of bioprosthetic aortic valve replacement surgery.   DISCHARGE MEDICATIONS:   1. Depakote 250 mg extended release p.o. daily in the morning.  2. Namenda 10 mg p.o. daily.  3. Aspirin 81 mg p.o. daily.  4. Loratadine 10 mg p.o. daily p.r.n. for allergies.  5. Calcium vitamin D 1 tablet p.o. b.i.d.  6. Donepezil 10 mg p.o. daily.  7. Sertraline 25 mg p.o. at bedtime.  8. Depakote 500 mg extended release tablet at bedtime once a day.  9. Docusate calcium 240 mg p.o. daily.  10. Pravastatin 10 mg p.o. at bedtime.  11. Neurontin 400 mg p.o. b.i.d.  12. Melatonin 1 mg capsule p.o. at bedtime.  13. Milk of magnesia 30 mL b.i.d. p.r.n. for constipation.  14. Tylenol 650 mg q. 6 hours p.r.n. for pain.  15. Mucinex 600 mg p.o. b.i.d. for 7 days.  16. Albuterol inhaler 2 puffs q. 6 hours p.r.n. for shortness of breath.  17. Levaquin 500 mg p.o. daily for 3 more days.  18. Amiodarone 200 mg p.o. daily.  19. Digoxin 125 mcg p.o. daily.  20. Metoprolol 50 mg p.o. b.i.d.  21. Advair 250/50 one puff b.i.d.  22. Lasix 40 mg p.o. daily.  23. Potassium chloride 20 mEq p.o. daily while on Lasix.   DISCHARGE DIET: Low-sodium diet.   DISCHARGE ACTIVITY: As tolerated.     FOLLOWUP INSTRUCTIONS:  1.  PCP followup in 1 week.  2.  Cardiology followup in 1 week.  3.  Potassium check in 1 week while on Lasix and potassium supplements.   LABORATORIES AND IMAGING STUDIES PRIOR TO DISCHARGE:  1.  WBC 11.4, hemoglobin 15.3, hematocrit 45.8, platelet count 156,000.  2.  Sodium 142, potassium 3.8, chloride 100, bicarbonate 34, BUN 24, creatinine 0.96, glucose 94, and calcium of 8.5.  3.  Chest x-ray showing small bilateral pleural effusions, mild interstitial edema.  4.  Urinalysis negative for any infection.  5.  Echocardiogram showing severely depressed global LV systolic function, EF 25%-30%, septal wall motion abnormality consistent with left bundle branch block, severe tricuspid regurgitation, aortic valve is abnormally functioning bioprosthetic valve, severe aortic valve stenosis. Severe tricuspid regurgitation noted as well.  6.  Blood cultures negative on admission.  7.  BNP was elevated at 8276 on admission.   BRIEF HOSPITAL COURSE: Beverly Warren is an [age] year old elderly Caucasian female from assisted living facility, history of dementia with behavioral disturbances, COPD, CHF, who presents to the hospital as noted by her physician who was making house calls that she was very dyspneic and her heart rate in the 160s.    1.  Atrial fibrillation with RVR, known history of atrial fibrillation, sick sinus syndrome status post pacemaker. She is allergic to Cardizem so her rate was controlled with amiodarone drip and digoxin. Her  blood pressure was low normal to begin with so beta blocker was not added initially. She was weaned off the amiodarone drip. She was started on p.o. amiodarone daily along with digoxin and slowly metoprolol has been added and adjusted to 50 mg p.o. b.i.d. at this time. She is on aspirin and not a good candidate for long-term anticoagulation. Dr. Juliann Pares discussed this with the son. Echo showing multiple abnormalities including valve abnormalities,  low EF and also left bundle branch block conduction abnormality noted. Outpatient followup recommended. Heart rate is controlled in 80-100 range at this time.  2.  Acute on chronic CHF exacerbation, chronic systolic CHF.  Initially Lasix was stopped, but x-ray showed pulmonary edema, so Lasix along with potassium supplements are being restarted. Echo showing severe low ejection fraction of 25-30%. She is on metoprolol.  ACE inhibitor or Aldactone cannot be started because of low blood pressure at this time. She is on digoxin anyways and also on statin, so outpatient cardiology followup recommended.  3.  COPD and pneumonia, left base scarring versus pneumonia on admission. Because of her difficulty breathing she was started on antibiotics, so she is on inhalers for her COPD, no active wheezing. She is on antibiotics for her pneumonia.  4.  Dementia with behavioral disturbances, has been calm but disoriented at baseline. All her home medications are being continued.  5.  Physical therapy followed the patient while in the hospital. They recommended skilled nursing facility.   DISCHARGE DISPOSITION: Guarded with poor long-term prognosis.  Discharge disposition to skilled nursing facility bed when available.   TIME SPENT ON DISCHARGE: 45 minutes.    ____________________________ Enid Baas, MD rk:bu D: 02/11/2014 14:54:16 ET T: 02/11/2014 15:51:46 ET JOB#: [id number]  cc: Enid Baas, MD, <Dictator> Dwayne D. Juliann Pares, MD Enid Baas MD ELECTRONICALLY SIGNED 02/16/2014 16:49

## 2014-06-13 NOTE — Consult Note (Signed)
PATIENT NAME:  Beverly AgeeMCDOW, Addysen MR#:  [id number] DATE OF BIRTH:  02-19-1925  DATE OF CONSULTATION:  05/29/2014  REFERRING PHYSICIAN:   CONSULTING PHYSICIAN:  Leitha SchullerMichael J. Eliav Mechling, MD  REASON FOR CONSULTATION:  L1 compression fracture.   HISTORY OF PRESENT ILLNESS:  The patient is an 81 year old female who suffers from dementia.  History is obtained from her son.  She had pneumonia back in December and had been previously ambulatory.  Since then, she has been weak and undergoing rehabilitation and just recently got to where she could walk 20 feet with minimal assistance.  After doing that this week, she had severe pain and came to the Emergency Room with severe abdominal and back pain.  CT scan showed a significant L1 compression fracture and I was consulted for evaluation and treatment.    PHYSICAL EXAMINATION:  She has no clonus.  No pain with straight leg raising.  She is very tender over her mid back and has minimal thoracic kyphosis deformity.  Skin is intact. There some adjacent paraspinous muscle spasm.  Strength exam is difficult secondary to her lack of cooperation and confusion but she is able to flex and extend the toes and flex her hips and does respond to light touch.  Again,  no clonus.  CT scan revealed an L1 compression fracture through the mid body with minimal retropulsion.   CLINICAL IMPRESSION:  L1 compression fracture.   RECOMMENDATION:  Ordered scheduled pain medication today and physical therapy.  If she is unable to get out of bed and ambulate secondary to her pain, I think it is reasonable to proceed with kyphoplasty.  I discussed that with her son and we will discuss it again tomorrow.  We can schedule this surgery for the following day if indicated.  Thank you for the consult.    ____________________________ Leitha SchullerMichael J. Tavarious Freel, MD [id number] D: 05/29/2014 15:54:21 ET T: 05/29/2014 16:28:08 ET JOB#: [id number]  cc: Leitha SchullerMichael J. Plummer Matich, MD, <Dictator> Leitha SchullerMICHAEL J Amariyah Bazar MD ELECTRONICALLY  SIGNED 05/29/2014 20:18

## 2014-06-13 NOTE — Op Note (Signed)
PATIENT NAME:  Beverly Warren, Beverly Warren MR#:  [id number] DATE OF BIRTH:  1925/04/02  DATE OF PROCEDURE:  05/31/2014  PREOPERATIVE DIAGNOSIS:  L1 compression fracture.   POSTOPERATIVE DIAGNOSIS:  L1 compression fracture.   PROCEDURE:  L1 kyphoplasty.   ANESTHESIA:  MAC.   SURGEON:  Kennedy BuckerMichael Nhan Qualley, MD.   DESCRIPTION OF PROCEDURE:  The patient was brought to the operating room, and after adequate anesthesia was obtained, the patient was placed prone.  C-arm was brought in and good visualization of L1 was obtained.  After prepping and draping in the usual sterile fashion, appropriate patient identification and timeout procedure were completed.  Prior to this, 10 mL of 1% Xylocaine were infiltrated subcutaneously on the right and left for the procedure after initial timeout procedure and patient identification completed.  Following this, the back was prepped and draped.  A small skin incision was made on the right side and a trocar advanced into the vertebral body, checking on AP and lateral imaging.  A small amount of specimen was obtained.  It seemed to track along the cleft and there was very little bone present.  Drilling was carried out followed by a balloon.  Balloon was inflated to 3 mL and it crossed the midline, so a left-sided stick was not required.  Cement was mixed and was the appropriate consistency.  Approximately 4.5 mL was placed with very good fill of the body and good interdigitation along the cleft superiorly and inferiorly.  The trocar was removed and permanent C-arm views obtained.  The wound was dressed with Dermabond and then a Band-Aid.  The patient was sent to the recovery room in stable condition.   ESTIMATED BLOOD LOSS:  Minimal.   COMPLICATIONS:  None.   SPECIMEN:  L1 vertebral body biopsy.   CONDITION IN RECOVERY ROOM:  Stable.    ____________________________ Leitha SchullerMichael J. Gibson Lad, MD mjm:kc D: 05/31/2014 20:54:13 ET T: 05/31/2014 22:15:35 ET JOB#: [id number]  cc: Leitha SchullerMichael J. Freeman Borba,  MD, <Dictator> Leitha SchullerMICHAEL J Alamin Mccuiston MD ELECTRONICALLY SIGNED 06/01/2014 1:11

## 2014-06-13 NOTE — Discharge Summary (Signed)
PATIENT NAME:  Beverly Warren, Beverly Warren MR#:  [id number] DATE OF BIRTH:  October 01, 1925  DATE OF ADMISSION:  05/29/2014 DATE OF DISCHARGE:  06/01/2014  PRIMARY CARE PHYSICIAN: [facility]   FINAL DIAGNOSES: 1.  Acute L1 compression fracture status post kyphoplasty.  2.  Elevated troponin.  3.  Severe dementia.  4.  History of atrial fibrillation.   DISCHARGE MEDICATIONS: Include aspirin 81 mg daily, loratadine 10 mg daily, calcium and vitamin D 1 tablet twice a day, donepezil 10 mg 1 tablet at bedtime, Colace 240 mg once a day, pravastatin 10 mg at bedtime, Depakote 125 mg extended-release 1 tablet daily in the morning, metoprolol tartrate 25 mg 1/2 tablet twice a day, ipratropium nasal spray 2 sprays each nostril twice a day for rhinorrhea, gabapentin 400 mg at bedtime, gabapentin 100 mg in the morning, ProAir HFA 2 puffs every 6 hours as needed for cough or wheezing, Namenda 10 mg once a day, artificial tears 2 drops both eyes 3 times a day, potassium chloride a total of 30 mEq daily, furosemide 40 mg daily, amiodarone 200 mg half tablet daily, acetaminophen 325 mg 2 tablets every 4 hours as needed for mild pain, oxycodone 1 tablet every 4 hours as needed for moderate pain, calcitonin nasal spray 1 spray alternating nostrils once a day, digoxin 0.125 mg p.o. daily for heart rate greater than 60.   DISCHARGE DIET: Low sodium diet, regular consistency.   DISCHARGE ACTIVITY: As tolerated.  DISCHARGE FOLLOWUP: Follow up with physical therapy at rehab.   HISTORY: The patient was admitted May 29, 2014 and discharged June 01, 2014. Came in with back pain, found to have a compression fracture.   LABORATORY AND RADIOLOGICAL DATA DURING HOSPITAL COURSE: Included an EKG that showed heart rate 102 beats per minute, left bundle branch block.   Troponin borderline at 0.05. Glucose 99, BUN 13, creatinine 0.8, sodium 138, potassium 3.6, chloride 99, CO2 30, calcium 8.6. Liver function tests normal  range. Lipase 31. White blood cell count 9.6, hemoglobin and hematocrit 15.1 and 44.2, platelet count 234,000. Urinalysis negative. Valproic acid less than 10. Digoxin level 0.5.   CT scan of the abdomen and pelvis showed transverse fracture through the L1 vertebra extending into the left posterior elements.  Next 2 troponins trended down, at 0.04. LDL 119, HDL 39. Hemoglobin upon discharge 14. Creatinine 0.65.   HOSPITAL COURSE PER PROBLEM LIST:  1.  For the patient's acute L1 compression fracture, the patient went for kyphoplasty on Monday, May 31, 2014, by Dr. Rosita KeaMenz, and will go out to rehab on June 01, 2014. As needed pain medications with oxycodone and Tylenol.  2.  For the patient's elevated troponin, it trended down and it was only borderline. The patient had no chest pain or shortness of breath. No further work-up necessary.  3.  For severe dementia, she was kept on her usual medications.  4.  History of atrial fibrillation. She is on metoprolol, amiodarone, and low-dose digoxin.  TIME SPENT ON DISCHARGE: 38 minutes.   ____________________________ Herschell Dimesichard J. Renae GlossWieting, MD rjw:sb D: 06/01/2014 08:07:34 ET T: 06/01/2014 08:48:08 ET JOB#: [id number]  cc: Herschell Dimesichard J. Renae GlossWieting, MD, <Dictator> [facility]  Salley ScarletICHARD J Charis Juliana MD ELECTRONICALLY SIGNED 06/02/2014 12:51

## 2014-06-13 NOTE — H&P (Signed)
PATIENT NAME:  Beverly Warren, Beverly Warren DATE OF BIRTH:  Jun 22, 1925  DATE OF ADMISSION:  05/29/2014  PRIMARY CARE PHYSICIAN: Briar ChapelJulianne Beach, AGNP-BC.  CHIEF COMPLAINT: Severe back pain.   HISTORY OF PRESENT ILLNESS: This is an 81 year old female [facility] and has been having severe back pain for a while. They have been trying to give her Tylenol without any relief. The patient does have multiple allergies including codeine and opiates but the son thinks that she can take oxycodone. She has been having trouble walking for the past 3 to 4 months. Recently, the patient did get over pneumonia back in December and again in April. In the ER, they drew some blood work that had a borderline troponin at 0.05. A CT scan of the abdomen and pelvis was done that showed a transverse fracture through L1 vertebral body extending into the left posterior elements; appears recent. No significant loss of vertebral body height. Hospitalist services were contacted for further evaluation.   PAST MEDICAL HISTORY: Congestive heart failure, dementia, left bundle branch block, atrial fibrillation, hyperlipidemia.   PAST SURGICAL HISTORY: Ankle surgery, hip surgery, heart valve surgery 4 years ago and pacemaker.   ALLERGIES: ACE INHIBITOR, CODEINE, DILTIAZEM, MORPHINE, OPIATES, PENICILLIN, SULFA, TESSALON PERLES.   MEDICATION LIST: Includes: 1. Amiodarone 200 mg half tablet daily.  2. Artificial tears 2 drops both eyes 3 times a day.  3. Aspirin 81 mg daily.  4. Calcium with vitamin D 1 tablet twice a day.  5. Digoxin 125 mcg daily.  6. Depakote 125 mg delayed release daily.  7. Colace 240 mg daily.  8. Benazepril 10 mg at bedtime.  9. Advair Diskus 250/50 one puff twice a day.  10. Lasix 40 mg daily.  11. Gabapentin 100 mg daily.  12. Gabapentin 400 mg at bedtime.  13. Ipratropium nasal spray 2 sprays each nostril twice a day.  14. Loratadine 10 mg daily.  15. Namenda 10 mg daily.   16. Metoprolol tartrate 12.5 mg twice a day.  17. Potassium chloride 10 mEq daily.  18. Pravastatin 10 mg daily.  19. ProAir HFA as needed.  20. She is on Zoloft, tapered off.  21. Tylenol 650 mg 3 times a day as needed for pain.   SOCIAL HISTORY: [facility]. Quit smoking 30 years ago. No alcohol. No drug use. Used to work as a Runner, broadcasting/film/videoteacher.   FAMILY HISTORY: Father died of heart disease. Mother died at 2191 of old age.   REVIEW OF SYSTEMS:  CONSTITUTIONAL: No fever, chills, or sweats. No weight loss. No weight gain.  EYES: She does wear glasses.  EARS, NOSE, MOUTH AND THROAT: No hearing loss. No sore throat. No difficulty with swallowing.  CARDIOVASCULAR: No chest pain. No palpitations.  RESPIRATORY: No shortness of breath. No cough. No sputum. No hemoptysis.  GASTROINTESTINAL: No nausea. No vomiting. No abdominal pain. No diarrhea. No constipation.  GENITOURINARY: No burning on urination.  MUSCULOSKELETAL: Positive for low back pain.  INTEGUMENT: No rashes or eruptions.  NEUROLOGIC: No fainting or blackouts.  PSYCHIATRIC: On medication for dementia.  ENDOCRINE: No thyroid problems.  HEMATOLOGIC AND LYMPHATIC: No anemia.   PHYSICAL EXAMINATION: VITAL SIGNS: On presentation to the ER include temperature 97.6, pulse 100, respirations 22, blood pressure 139/90, pulse oximetry 100% on room air.  GENERAL: No respiratory distress.  EYES: Conjunctivae and lids normal. Pupils equal, round, and reactive to light. Extraocular muscles intact. No nystagmus.  EARS, NOSE, MOUTH  AND THROAT: Tympanic membranes, no erythema. Nasal mucosa: No erythema. Throat: No erythema. No exudate seen. Lips and gums: No lesions.  NECK: No JVD. No bruits. No lymphadenopathy. No thyromegaly. No thyroid nodules palpated.  LUNGS: Clear to auscultation. No use of accessory muscles to breathe. No rhonchi, rales or wheeze heard.  CARDIOVASCULAR: S1, S2 normal. No gallops, rubs or murmurs heard. Carotid  upstroke 2+ bilaterally. No bruits. Dorsalis pedis pulses 2+ bilaterally. No edema of the lower extremities.  ABDOMEN: Soft, nontender. No organomegaly or splenomegaly. Normoactive bowel sounds. No masses felt.  LYMPHATIC: No lymph nodes in the neck.  MUSCULOSKELETAL: No clubbing, edema, cyanosis.  SKIN: No ulcers or lesions.  NEUROLOGIC: Cranial nerves II through XII grossly intact. Deep tendon reflexes 0.5+ bilateral half + bilateral lower extremity. The patient able to straight leg raise bilaterally. Babinski negative. Sensation to light touch grossly intact bilateral lower extremities.  PSYCHIATRIC: The patient is alert and oriented to person and place.   LABORATORY AND RADIOLOGICAL DATA: Troponin borderline at 0.05. Glucose 99, BUN 13, creatinine 0.8, sodium 138, potassium 3.6, chloride 99, CO2 of 30, calcium 8.6. Liver function test: Negative. Lipase 31. White blood cell count 9.6, hemoglobin and hematocrit 15.1 and 44.2, platelet count of 234,000. Urinalysis 1+ ketones. CT scan of the abdomen and pelvis showed compression fracture at L1 vertebral body.   ASSESSMENT AND PLAN: 1. Severe back pain with L1 compression fracture seen on CT scan. We will give pain control. Son thinks she can take oxycodone. We will try that. We will give a quick prednisone taper just in case inflammatory. We will get an orthopedic evaluation, physical therapy evaluation. The patient may end up needing rehab. Will give Miacalcin nasal spray and pain control.  2. Elevated troponin with no chest pain or shortness of breath. No further cardiac work-up besides serial troponins and telemetry. The patient is already on aspirin, metoprolol and statin.  3. Severe dementia. The patient is able to answer yes or no questions, having more difficulty with walking recently. Continue her usual psychiatric medications at this point. Can probably stop the Zoloft; the dose is low enough.  4. History of congestive heart failure. No signs  on this hospital stay so far. Continue the Lasix. Stop the IV fluids that were ordered in the emergency room.  5. History of atrial fibrillation on amiodarone and digoxin.   TIME SPENT ON ADMISSION: 55 minutes.   CODE STATUS: The patient is a full code.    ____________________________ Herschell Dimes. Renae Gloss, MD rjw:TT D: 05/29/2014 14:54:13 ET T: 05/29/2014 15:34:09 ET JOB#: [id number]  cc: Herschell Dimes. Renae Gloss, MD, <Dictator> [facility] Salley Scarlet MD ELECTRONICALLY SIGNED 06/02/2014 12:51

## 2014-06-13 NOTE — Consult Note (Signed)
Brief Consult Note: Diagnosis: L1 compression fracture.   Patient was seen by consultant.   Comments: appears to have severe pain, possible candidtate for kyphoplasty will discuss with family.  Electronic Signatures: Leitha SchullerMenz, Tiyana Galla J (MD)  (Signed 16-Apr-16 09:32)  Authored: Brief Consult Note   Last Updated: 16-Apr-16 09:32 by Leitha SchullerMenz, Faustina Gebert J (MD)

## 2015-04-13 DEATH — deceased

## 2015-12-18 IMAGING — XA DG C-ARM 61-120 MIN
1 series · 1 of 1 positions shown · non-contrast
Comparison: CT abdomen pelvis - 05/29/2014

CLINICAL DATA: Cement augmentation of vertebral body.

EXAM:
DG C-ARM 61-120 MIN; LUMBAR SPINE - 2-3 VIEW
FLUOROSCOPY TIME:  Not provided.

[Series 6001: ap · 1 of 1 slices shown]
[im 1/1]
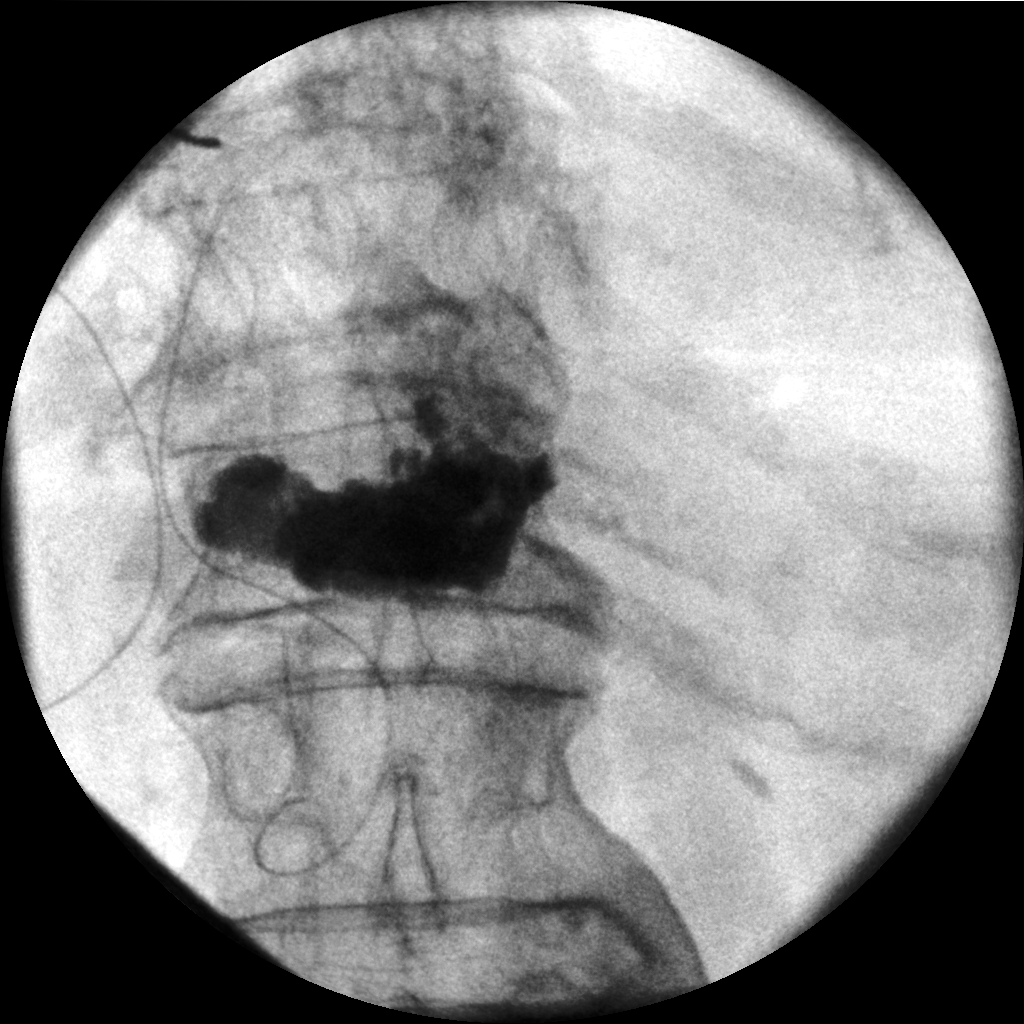

[1 of 1 positions shown; findings below may reference images not displayed]

FINDINGS: Two spot coned fluoroscopic images of the spine are provided for
review.

Spinal labeling is not possible secondary [REDACTED]d field-of-view as
well as exclusion of the lumbosacral junction.

Provided images demonstrate the sequela of cement augmentation of a
vertebral body. Cement appears confined to the treated vertebral
body.
IMPRESSION: Post cement augmentation of a vertebral body. Correlation with the
operative report is recommended.
